# Patient Record
Sex: Female | Born: 2000 | Race: Black or African American | Hispanic: No
Health system: Southern US, Community
[De-identification: ages and names within clinical notes are randomized; demographics above are authoritative.]

## PROBLEM LIST (undated history)

## (undated) HISTORY — PX: WISDOM TOOTH EXTRACTION: SHX21

---
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
## 2018-04-10 ENCOUNTER — Other Ambulatory Visit: Payer: Self-pay

## 2018-04-10 ENCOUNTER — Emergency Department (HOSPITAL_COMMUNITY)
Admission: EM | Admit: 2018-04-10 | Discharge: 2018-04-10 | Disposition: A | Discharge status: Home / Self Care | Payer: Managed Care, Other (non HMO) | Attending: Emergency Medicine | Admitting: Emergency Medicine

## 2018-04-10 ENCOUNTER — Encounter (HOSPITAL_COMMUNITY): Payer: Self-pay | Admitting: Emergency Medicine

## 2018-04-10 DIAGNOSIS — Z79899 Other long term (current) drug therapy: Secondary | ICD-10-CM | POA: Diagnosis not present

## 2018-04-10 DIAGNOSIS — H66002 Acute suppurative otitis media without spontaneous rupture of ear drum, left ear: Secondary | ICD-10-CM | POA: Diagnosis not present

## 2018-04-10 DIAGNOSIS — H9202 Otalgia, left ear: Secondary | ICD-10-CM | POA: Diagnosis present

## 2018-04-10 MED ORDER — FLUCONAZOLE 150 MG PO TABS: 150.0000 mg | ORAL_TABLET | Freq: Once | ORAL | 0 refills | Status: AC

## 2018-04-10 MED ORDER — AMOXICILLIN 500 MG PO CAPS: 500.0000 mg | ORAL_CAPSULE | Freq: Three times a day (TID) | ORAL | 0 refills | Status: DC

## 2018-04-10 NOTE — ED Notes (Signed)
Patient verbalizes understanding of discharge instructions. Opportunity for questioning and answers were provided. Armband removed by staff, pt discharged from ED.  

## 2018-04-10 NOTE — Discharge Instructions (Addendum)
Please take all of your antibiotic medication as prescribed. Please follow-up with your primary care provider regarding your visit today. You may use the one-time dose of fluconazole if you require it for a yeast infection.  But please follow-up with your primary care provider regarding this as well. You may take over-the-counter anti-inflammatory medications as needed for pain.  Contact a doctor if: You have otitis media only in one ear, or bleeding from your nose, or both. You notice a lump on your neck. You are not getting better in 3-5 days. You feel worse instead of better. Get help right away if: You have pain that is not helped with medicine. You have puffiness, redness, or pain around your ear. You get a stiff neck. You cannot move part of your face (paralysis). You notice that the bone behind your ear hurts when you touch it

## 2018-04-10 NOTE — ED Triage Notes (Signed)
Pt reports left ear pain since 7/4, denies swimming or any known injury. States ear feels full, used ear candle to see if she could get relief but states no wax came out. Not taking any meds for pain

## 2018-04-10 NOTE — ED Provider Notes (Signed)
[facility] EMERGENCY DEPARTMENT Provider Note   CSN: [id number] Arrival date & time: 04/10/18  9562     History   Chief Complaint Chief Complaint  Patient presents with  . Otalgia    HPI Victoria Meza is a 17 y.o. female presenting with left ear pain that began on Friday morning 04/08/2018.  Patient states that after 4 July she went to sleep without pain and then woke up with left ear pain that has gradually worsened.  Patient describes her pain as a full feeling that is occasionally sharp.  Patient describes her pain as a moderate 5/10 in severity.  Patient endorses a feeling of muffled hearing to the left ear.  Patient has been using ear candles without relief for the past 2 days. Patient denies history of fever, swimming, nausea, vomiting, headache. HPI  History reviewed. No pertinent past medical history.  There are no active problems to display for this patient.   Past Surgical History:  Procedure Laterality Date  . WISDOM TOOTH EXTRACTION    . WISDOM TOOTH EXTRACTION       OB History   None      Home Medications    Prior to Admission medications   Medication Sig Start Date End Date Taking? Authorizing Provider  albuterol (PROVENTIL HFA;VENTOLIN HFA) 108 (90 Base) MCG/ACT inhaler Inhale 1-2 puffs into the lungs every 6 (six) hours as needed for wheezing or shortness of breath.    [provider]  amoxicillin (AMOXIL) 500 MG capsule Take 1 capsule (500 mg total) by mouth 3 (three) times daily. 04/10/18   Harlene Salts A, PA-C  ECHINACEA PO Take 1 tablet by mouth daily.    [provider]  fluconazole (DIFLUCAN) 150 MG tablet Take 1 tablet (150 mg total) by mouth once for 1 dose. 04/10/18 04/10/18  Harlene Salts A, PA-C  ondansetron (ZOFRAN ODT) 4 MG disintegrating tablet 4mg  ODT q4 hours prn nausea/vomit 10/10/15   Hess, Melina Schools M, PA-C  ondansetron (ZOFRAN ODT) 4 MG disintegrating tablet Take 1 tablet (4 mg total) by mouth every 8  (eight) hours as needed for nausea or vomiting. 04/17/17   Earley Favor, NP  predniSONE (DELTASONE) 20 MG tablet Take 2 tablets (40 mg total) by mouth daily with breakfast. For the next four days 08/26/17   Gerhard Munch, MD  vitamin C (ASCORBIC ACID) 500 MG tablet Take 500 mg by mouth daily.    [provider]    Family History No family history on file.  Social History Social History   Tobacco Use  . Smoking status: Never Smoker  . Smokeless tobacco: Never Used  Substance Use Topics  . Alcohol use: No    Frequency: Never  . Drug use: No     Allergies   Patient has no known allergies.   Review of Systems Review of Systems  Constitutional: Negative.  Negative for chills and fever.  HENT: Positive for ear pain. Negative for congestion, ear discharge, rhinorrhea, sinus pressure, sinus pain, sore throat, trouble swallowing and voice change.   Eyes: Negative.  Negative for pain, discharge and visual disturbance.  Respiratory: Negative.  Negative for cough and shortness of breath.   Cardiovascular: Negative.   Gastrointestinal: Negative.  Negative for abdominal pain, diarrhea, nausea and vomiting.  Genitourinary: Negative.  Negative for dysuria and hematuria.  Musculoskeletal: Negative.  Negative for myalgias and neck pain.  Skin: Negative.  Negative for rash.  Neurological: Negative.  Negative for headaches.  Physical Exam Updated Vital Signs BP 111/80 (BP Location: Right Arm)   Pulse 85   Temp 98.4 F (36.9 C) (Oral)   Resp 18   Ht 5\' 5"  (1.651 m)   Wt 56.2 kg (124 lb)   SpO2 98%   BMI 20.63 kg/m   Physical Exam  Constitutional: She is oriented to person, place, and time. She appears well-developed and well-nourished. No distress.  HENT:  Head: Normocephalic and atraumatic.  Right Ear: Hearing, tympanic membrane, external ear and ear canal normal. No drainage or swelling. No mastoid tenderness. Tympanic membrane is not perforated, not erythematous  and not bulging.  Left Ear: External ear normal. No drainage or swelling. No mastoid tenderness. Tympanic membrane is erythematous and bulging. Tympanic membrane is not perforated.  Nose: Rhinorrhea present.  Mouth/Throat: Uvula is midline. No trismus in the jaw. No posterior oropharyngeal edema or tonsillar abscesses.    Patient with erythematous, bulging left tympanic membrane without signs of rupture or drainage.  No mastoid tenderness or swelling. Posterior oropharynx erythematous with single vesicle noted on the left palatal pharyngeal arch.  No signs of swelling, peritonsillar abscess, Ludwig's angina, retropharyngeal abscess.  Uvula is midline, no tonsillar swelling. Nasal mucosa slightly erythematous with mild rhinorrhea present.   Eyes: Pupils are equal, round, and reactive to light. EOM are normal.  Neck: Normal range of motion. Neck supple. No tracheal deviation present.  Pulmonary/Chest: Effort normal.  Abdominal: Soft. There is no tenderness. There is no guarding.  Musculoskeletal: Normal range of motion.  Lymphadenopathy:    She has no cervical adenopathy.  Neurological: She is alert and oriented to person, place, and time.  Skin: Skin is warm and dry.  Psychiatric: She has a normal mood and affect. Her behavior is normal.     ED Treatments / Results  Labs (all labs ordered are listed, but only abnormal results are displayed) Labs Reviewed - No data to display  EKG None  Radiology No results found.  Procedures Procedures (including critical care time)  Medications Ordered in ED Medications - No data to display   Initial Impression / Assessment and Plan / ED Course  I have reviewed the triage vital signs and the nursing notes.  Pertinent labs & imaging results that were available during my care of the patient were reviewed by me and considered in my medical decision making (see chart for details).    Patient with clinical signs of upper respiratory  infection but denies symptoms.   Patient presenting with left ear pain for 2 days.  Clinical findings consistent with acute otitis media of the left ear.  Patient will be treated with amoxicillin 500 mg 3 times daily for the next 7 days.  Patient without clinical signs or symptoms suggestive of mastoiditis, perforation, otitis externa.  Patient denies history of swimming, otorrhea, fever.  Patient denies recent treatment with antibiotics.  Patient encouraged to follow-up with her primary care provider regarding her visit today.  Patient informed that she may use over-the-counter anti-inflammatory medications as needed for pain.  Patient states that in the past when she takes antibiotic medication she often develops a vaginal yeast infection and is requesting Diflucan in case she develops symptoms.  I have prescribed a one-time dose of Diflucan and informed that she may use it if she develops symptoms.  I have encouraged the patient to follow-up with her primary care provider, [facility], for her ear infection as well as if she develops a yeast infection.  At this time there does not appear to be any evidence of an acute emergency medical condition and the patient appears stable for discharge with antibiotic treatment and appropriate outpatient follow up. Diagnosis was discussed with patient who verbalizes understanding and is agreeable to discharge. I have discussed return precautions with patient and family who verbalize understanding of return precautions. Patient strongly encouraged to follow-up with their PCP.  Patient's case discussed with Harolyn Rutherford PA-C who agrees with plan to discharge with follow-up.     This note was dictated using DragonOne dictation software; please contact for any inconsistencies within the note.   Final Clinical Impressions(s) / ED Diagnoses   Final diagnoses:  Non-recurrent acute suppurative otitis media of left ear without spontaneous rupture of  tympanic membrane    ED Discharge Orders        Ordered    amoxicillin (AMOXIL) 500 MG capsule  3 times daily     04/10/18 0926    fluconazole (DIFLUCAN) 150 MG tablet   Once     04/10/18 2956       Bill Salinas, PA-C 04/10/18 0957    Margarita Grizzle, MD 04/10/18 [phone]

## 2018-11-04 ENCOUNTER — Encounter (HOSPITAL_COMMUNITY): Payer: Self-pay | Admitting: Emergency Medicine

## 2018-11-04 ENCOUNTER — Emergency Department (HOSPITAL_COMMUNITY)
Admission: EM | Admit: 2018-11-04 | Discharge: 2018-11-04 | Disposition: A | Discharge status: Home / Self Care | Payer: Managed Care, Other (non HMO) | Attending: Emergency Medicine | Admitting: Emergency Medicine

## 2018-11-04 DIAGNOSIS — R300 Dysuria: Secondary | ICD-10-CM | POA: Insufficient documentation

## 2018-11-04 DIAGNOSIS — N644 Mastodynia: Secondary | ICD-10-CM | POA: Insufficient documentation

## 2018-11-04 DIAGNOSIS — L03313 Cellulitis of chest wall: Secondary | ICD-10-CM

## 2018-11-04 DIAGNOSIS — Z79899 Other long term (current) drug therapy: Secondary | ICD-10-CM | POA: Insufficient documentation

## 2018-11-04 LAB — URINALYSIS, ROUTINE W REFLEX MICROSCOPIC
BILIRUBIN URINE: NEGATIVE
Bacteria, UA: NONE SEEN
GLUCOSE, UA: NEGATIVE mg/dL
HGB URINE DIPSTICK: NEGATIVE
KETONES UR: NEGATIVE mg/dL
Nitrite: NEGATIVE
PH: 6 (ref 5.0–8.0)
Protein, ur: NEGATIVE mg/dL
Specific Gravity, Urine: 1.015 (ref 1.005–1.030)

## 2018-11-04 LAB — POC URINE PREG, ED: Preg Test, Ur: NEGATIVE

## 2018-11-04 MED ORDER — CEPHALEXIN 500 MG PO CAPS: 500.0000 mg | ORAL_CAPSULE | Freq: Four times a day (QID) | ORAL | 0 refills | Status: AC

## 2018-11-04 NOTE — ED Triage Notes (Signed)
Pt with left breast with pain and nodule found 2 days ago. She also reports dysuria, with frequency and urgency. Denies abdominal pain or abnormal bleeding.

## 2018-11-04 NOTE — Discharge Instructions (Signed)
Your exam today was consistent with possible cellulitis of the left breast.  Low suspicion for a tumor or deep infection.  No abscess was palpated.  Please take the antibiotics for the next week to help treat the likely skin infection.  Please follow-up with the breast center for further evaluation management.  Please follow-up with your PCP for further overall management.  If any symptoms change or worsen, please return to the nearest emergency department.

## 2018-11-04 NOTE — ED Notes (Signed)
ED Provider at bedside. 

## 2018-11-04 NOTE — ED Provider Notes (Signed)
[facility] Provider Note   CSN: [id number] Arrival date & time: 11/04/18  1215     History   Chief Complaint Chief Complaint  Patient presents with  . Breast Pain  . Urinary Tract Infection    HPI Victoria Meza is a 18 y.o. female.  The history is provided by the patient, a parent and medical records. No language interpreter was used.  Urinary Tract Infection  Pain quality:  Burning Pain severity:  Moderate Onset quality:  Gradual Duration:  3 days Timing:  Constant Progression:  Waxing and waning Chronicity:  Recurrent Relieved by:  Nothing Worsened by:  Nothing Ineffective treatments:  None tried Urinary symptoms: foul-smelling urine and frequent urination   Associated symptoms: no abdominal pain, no fever, no flank pain, no nausea, no vaginal discharge and no vomiting     History reviewed. No pertinent past medical history.  There are no active problems to display for this patient.   Past Surgical History:  Procedure Laterality Date  . WISDOM TOOTH EXTRACTION    . WISDOM TOOTH EXTRACTION       OB History   No obstetric history on file.      Home Medications    Prior to Admission medications   Medication Sig Start Date End Date Taking? Authorizing Provider  albuterol (PROVENTIL HFA;VENTOLIN HFA) 108 (90 Base) MCG/ACT inhaler Inhale 1-2 puffs into the lungs every 6 (six) hours as needed for wheezing or shortness of breath.    [provider]  amoxicillin (AMOXIL) 500 MG capsule Take 1 capsule (500 mg total) by mouth 3 (three) times daily. 04/10/18   Harlene SaltsMorelli, Brandon A, PA-C  ECHINACEA PO Take 1 tablet by mouth daily.    [provider]  ondansetron (ZOFRAN ODT) 4 MG disintegrating tablet 4mg  ODT q4 hours prn nausea/vomit 10/10/15   Hess, Melina Schoolsobyn M, PA-C  ondansetron (ZOFRAN ODT) 4 MG disintegrating tablet Take 1 tablet (4 mg total) by mouth every 8 (eight) hours as needed for nausea or vomiting.  04/17/17   Earley FavorSchulz, Gail, NP  predniSONE (DELTASONE) 20 MG tablet Take 2 tablets (40 mg total) by mouth daily with breakfast. For the next four days 08/26/17   Gerhard MunchLockwood, Robert, MD  vitamin C (ASCORBIC ACID) 500 MG tablet Take 500 mg by mouth daily.    [provider]    Family History No family history on file.  Social History Social History   Tobacco Use  . Smoking status: Never Smoker  . Smokeless tobacco: Never Used  Substance Use Topics  . Alcohol use: No    Frequency: Never  . Drug use: No     Allergies   Patient has no known allergies.   Review of Systems Review of Systems  Constitutional: Negative for chills, diaphoresis, fatigue and fever.  HENT: Negative for congestion and rhinorrhea.   Respiratory: Negative for cough, chest tightness, shortness of breath and wheezing.   Cardiovascular: Negative for chest pain, palpitations and leg swelling.  Gastrointestinal: Negative for abdominal pain, constipation, diarrhea, nausea and vomiting.  Genitourinary: Positive for dysuria and frequency. Negative for decreased urine volume, flank pain, genital sores, pelvic pain, urgency, vaginal bleeding, vaginal discharge and vaginal pain.  Musculoskeletal: Negative for back pain, neck pain and neck stiffness.  Skin: Negative for rash and wound.  Neurological: Negative for light-headedness, numbness and headaches.  Psychiatric/Behavioral: Negative for agitation, behavioral problems and confusion.     Physical Exam Updated Vital Signs BP (!) 141/75 (BP Location: Right  Arm)   Pulse 88   Temp 97.7 F (36.5 C) (Oral)   Resp 17   SpO2 100%   Physical Exam Exam conducted with a chaperone present.  Constitutional:      General: She is not in acute distress.    Appearance: Normal appearance. She is not ill-appearing, toxic-appearing or diaphoretic.  HENT:     Head: Normocephalic.     Nose: Nose normal. No congestion or rhinorrhea.     Mouth/Throat:     Mouth: Mucous  membranes are moist.     Pharynx: No oropharyngeal exudate or posterior oropharyngeal erythema.  Eyes:     Conjunctiva/sclera: Conjunctivae normal.     Pupils: Pupils are equal, round, and reactive to light.  Neck:     Musculoskeletal: No muscular tenderness.  Cardiovascular:     Rate and Rhythm: Normal rate.     Pulses: Normal pulses.     Heart sounds: No murmur.  Pulmonary:     Effort: Pulmonary effort is normal. No respiratory distress.     Breath sounds: No stridor. No wheezing, rhonchi or rales.  Chest:     Chest wall: Tenderness present. No mass, lacerations, deformity, swelling, crepitus or edema.     Breasts: Breasts are symmetrical.        Right: No swelling, inverted nipple, mass, nipple discharge, skin change or tenderness.        Left: Tenderness present. No swelling, inverted nipple, mass, nipple discharge or skin change.    Abdominal:     General: Abdomen is flat. There is no distension.     Tenderness: There is no abdominal tenderness. There is no right CVA tenderness or left CVA tenderness.  Musculoskeletal:        General: Tenderness present. No deformity.     Right lower leg: No edema.     Left lower leg: No edema.  Skin:    Capillary Refill: Capillary refill takes less than 2 seconds.     Findings: No erythema.  Neurological:     General: No focal deficit present.     Mental Status: She is alert and oriented to person, place, and time.     Sensory: No sensory deficit.     Motor: No weakness.  Psychiatric:        Mood and Affect: Mood normal.      ED Treatments / Results  Labs (all labs ordered are listed, but only abnormal results are displayed) Labs Reviewed  URINALYSIS, ROUTINE W REFLEX MICROSCOPIC - Abnormal; Notable for the following components:      Result Value   APPearance HAZY (*)    Leukocytes, UA SMALL (*)    All other components within normal limits  URINE CULTURE  POC URINE PREG, ED    EKG None  Radiology No results  found.  Procedures Procedures (including critical care time)  Medications Ordered in ED Medications - No data to display   Initial Impression / Assessment and Plan / ED Course  I have reviewed the triage vital signs and the nursing notes.  Pertinent labs & imaging results that were available during my care of the patient were reviewed by me and considered in my medical decision making (see chart for details).     Victoria Meza is a 18 y.o. female with no significant past medi history who presents with 2 concerns, urinary frequency, urgency, and dysuria as well as left breast pain.  Patient is accompanied by her mother who reports that she intermittently has  dysuria.  Mother has treated her with Azo but she continues to have symptoms.  She reports that is gone for the last few days.  She denies nausea vomiting, constipation, diarrhea.  She denies fevers or chills.  She denies any shortness of breath, congestion, cough, URI symptoms.  Patient second complaint is left breast pain.  Mother reports that they have a strong family history of fibrocystic breast disease.  Mother was concerned when she felt a nodule on the patient's breast today and the patient is felt that this discomfort for the last 2 days.  Patient just finished her menstrual cycle last week.  Patient does not think she is pregnant.  Has never had a breast infection or any other concern for abnormalities of the breast tissue.  Chaperone was present for exam.  On exam, patient had some tenderness diffusely in the left breast.  No lumps or bumps were palpated.  Ariel appeared symmetric on my exam.  No tissue was expressed from the breast.  No stippling or other abnormality on skin exam.  Lungs were clear and chest was otherwise nontender.  No murmur.  Right breast unremarkable.  Abdomen nontender.  Patient otherwise appears well.  Patient will have urinalysis to look for UTI.  Patient denies any pelvic symptoms, will not pursue  pelvic exam at this time.  Do not feel patient need screening laboratory testing however will check the urine.  Clinical aspect patient has idiopathic breast pain.  Low suspicion for cellulitis given lack of any discoloration, erythema, warmth.  No evidence of abscess on exam.  No fluctuance or crepitance.  Suspect possible fibrocystic breast disease.  The mother was not able to feel the lump that she palpated yesterday.  This might support that diagnosis.  Suspect patient may have mild cellulitis of the breast.  Patient will follow-up with PCP and the breast center for further evaluation of the breast tissue.  Do not feel she needs emergent imaging at this time.  Patient be reassessed after urinalysis.  Urinalysis shows no nitrites or bacteria, doubt UTI.  Culture will be sent.  For the patient's possible breast cellulitis, given her amount of tenderness, will prescribe antibiotics for presumptive cellulitis.  Patient will follow-up with the breast center for further evaluation of possible nodules.  Patient family agree with plan of care and management structures.  Patient with questions or concerns and was discharged in good condition.   Final Clinical Impressions(s) / ED Diagnoses   Final diagnoses:  Breast pain  Cellulitis of chest wall  Dysuria    ED Discharge Orders         Ordered    cephALEXin (KEFLEX) 500 MG capsule  4 times daily     11/04/18 1346          Clinical Impression: 1. Breast pain   2. Cellulitis of chest wall   3. Dysuria     Disposition: Discharge  Condition: Good  I have discussed the results, Dx and Tx plan with the pt(& family if present). He/she/they expressed understanding and agree(s) with the plan. Discharge instructions discussed at great length. Strict return precautions discussed and pt &/or family have verbalized understanding of the instructions. No further questions at time of discharge.    New Prescriptions   CEPHALEXIN (KEFLEX) 500 MG  CAPSULE    Take 1 capsule (500 mg total) by mouth 4 (four) times daily for 7 days.    Follow Up: Laurann MontanaWhite, Cynthia, MD [phone] [place]  [place] [address] [phone]     [facility] [address] [place] [id number] mc [place] [address] [phone]    [facility] EMERGENCY DEPARTMENT [address] [phone] mc [place] [address] [phone]       Damontay Alred, Canary Brim, MD 11/04/18 [phone]

## 2018-11-04 NOTE — ED Notes (Signed)
sunquest not available.

## 2018-11-05 LAB — URINE CULTURE: Culture: NO GROWTH

## 2018-11-24 ENCOUNTER — Other Ambulatory Visit: Payer: Self-pay | Admitting: Family Medicine

## 2018-11-24 DIAGNOSIS — N649 Disorder of breast, unspecified: Secondary | ICD-10-CM

## 2018-11-28 ENCOUNTER — Ambulatory Visit
Admission: RE | Admit: 2018-11-28 | Discharge: 2018-11-28 | Disposition: A | Discharge status: Home / Self Care | Payer: Managed Care, Other (non HMO) | Source: Ambulatory Visit | Attending: Family Medicine | Admitting: Family Medicine

## 2018-11-28 DIAGNOSIS — N649 Disorder of breast, unspecified: Secondary | ICD-10-CM

## 2018-12-03 IMAGING — CT CT ANGIO CHEST
2 of 6 series · 18 of 36 positions shown · IV contrast (Omni 300)
Comparison: Chest x-ray 08/26/2017

CLINICAL DATA: Chest pain since last evening.

EXAM:
CT ANGIOGRAPHY CHEST WITH CONTRAST
TECHNIQUE: Multidetector CT imaging of the chest was performed using the
standard protocol during bolus administration of intravenous
contrast. Multiplanar CT image reconstructions and MIPs were
obtained to evaluate the vascular anatomy.
CONTRAST:  100mL TPNYOG-52P IOPAMIDOL (TPNYOG-52P) INJECTION 76%

[Series 11: pe thins · axial · 0.65mm/px · z∈[+1296,+1488]mm · 17 of 217 slices shown]
[im 13/217  lung]
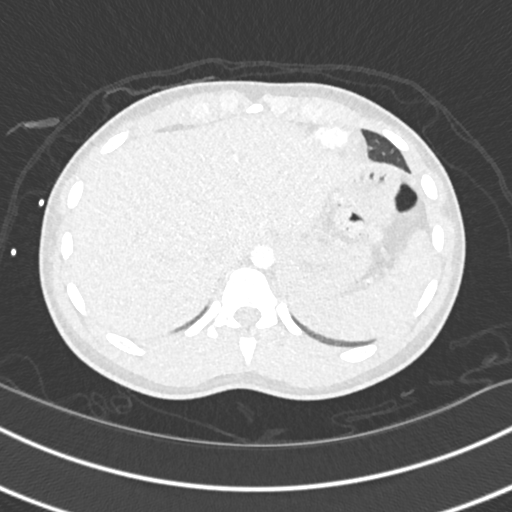
[im 25/217  mediastinal]
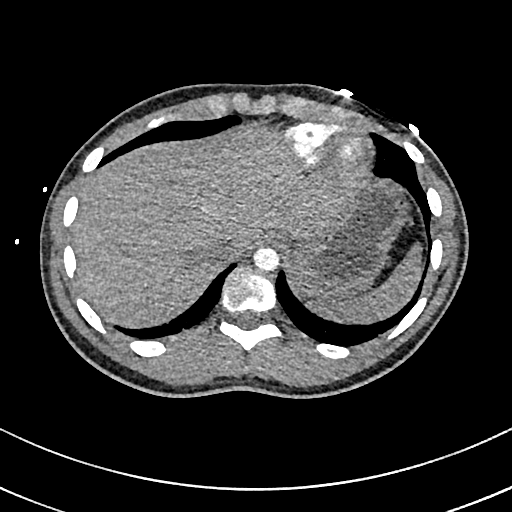
[im 37/217  lung]
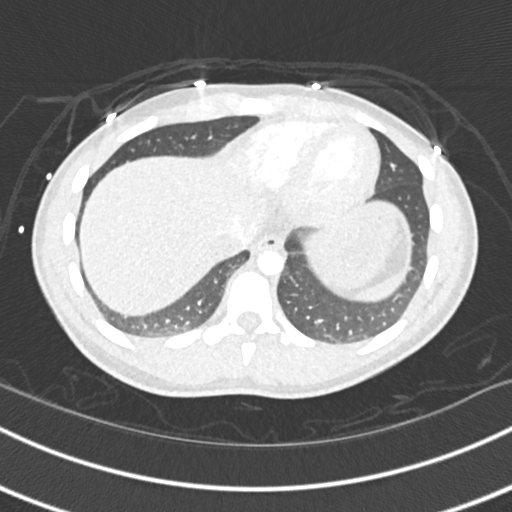
[im 49/217  mediastinal]
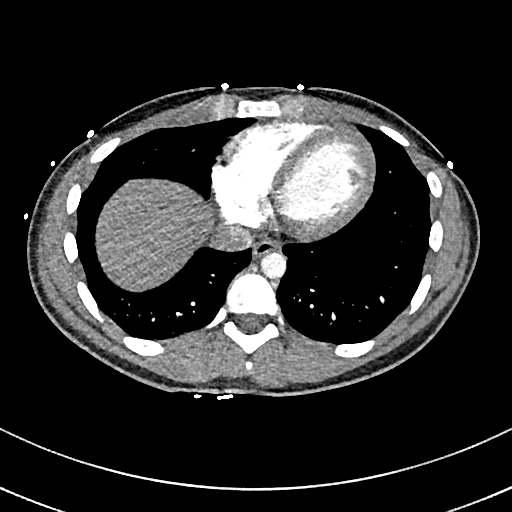
[im 61/217  lung]
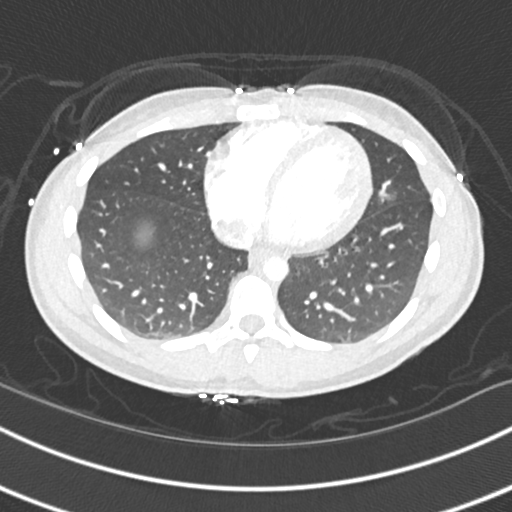
[im 73/217  mediastinal]
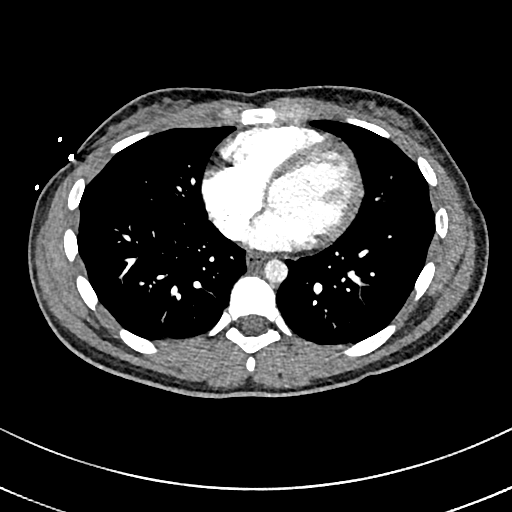
[im 85/217  lung]
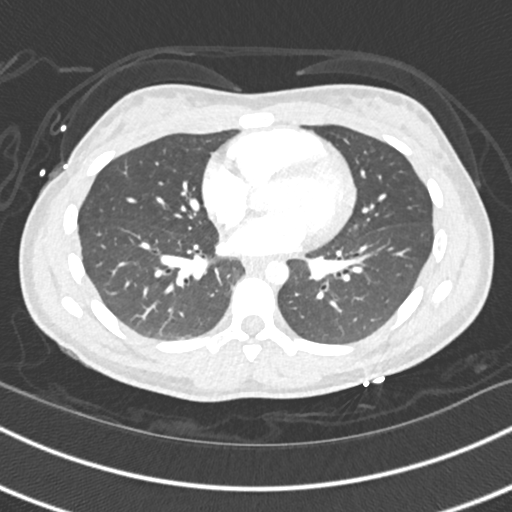
[im 97/217  mediastinal]
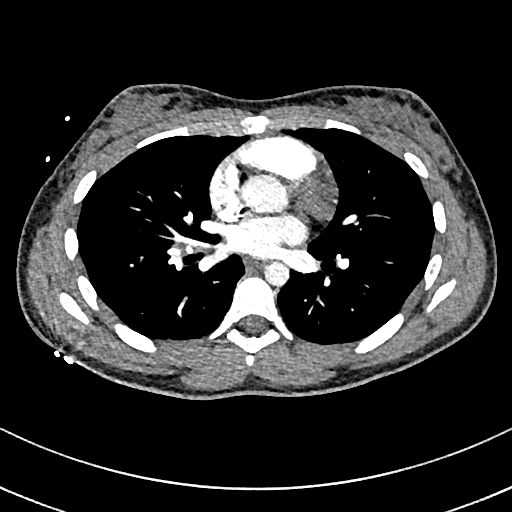
[im 109/217  lung]
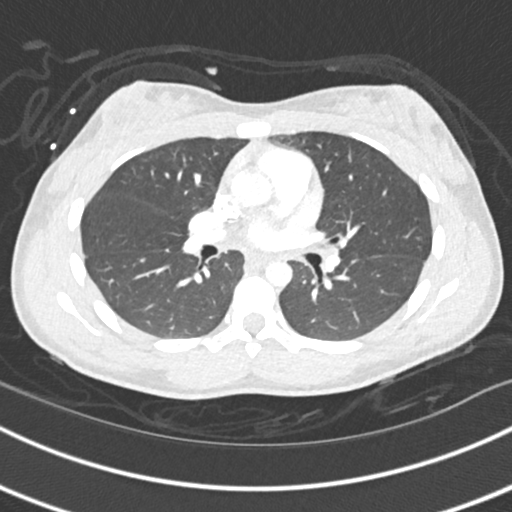
[im 121/217  mediastinal]
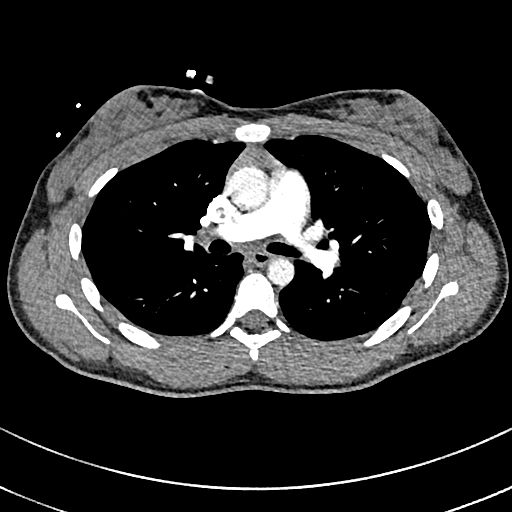
[im 133/217  lung]
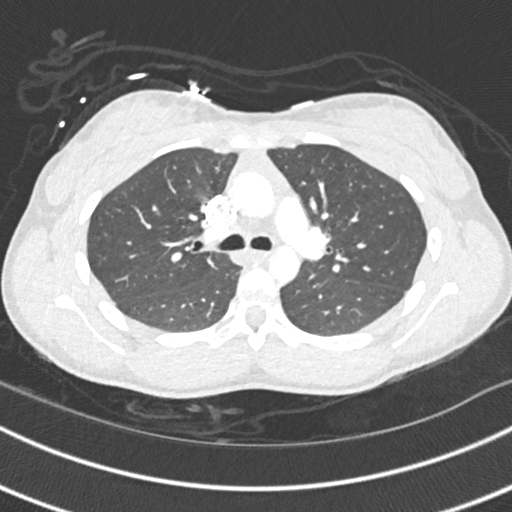
[im 145/217  mediastinal]
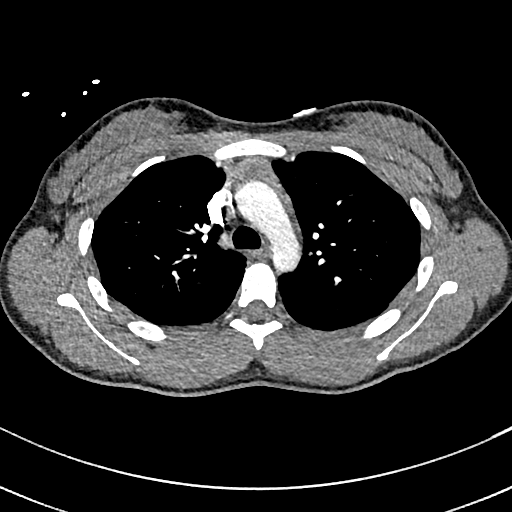
[im 157/217  lung]
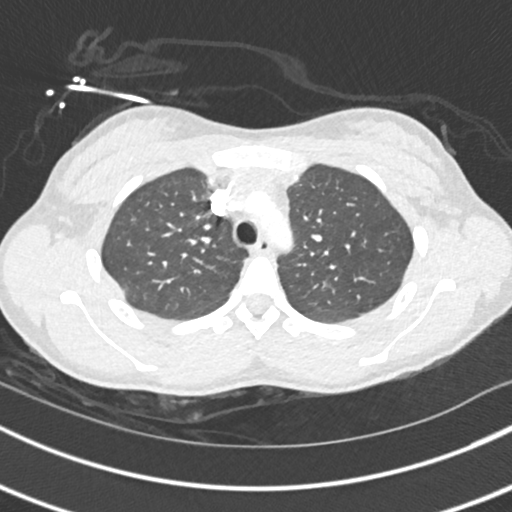
[im 169/217  mediastinal]
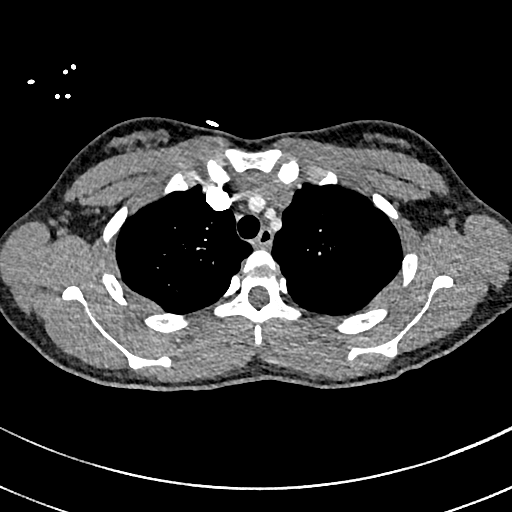
[im 181/217  lung]
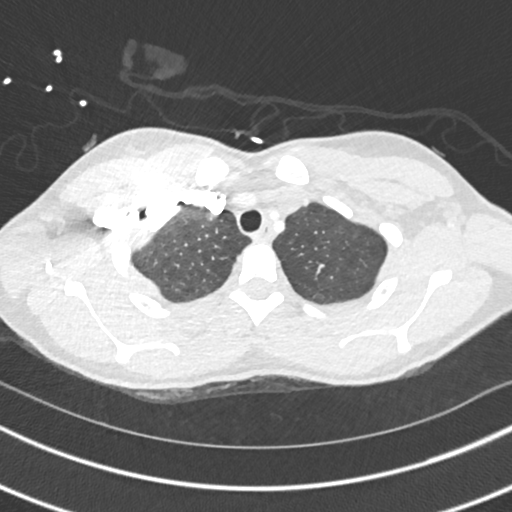
[im 193/217  mediastinal]
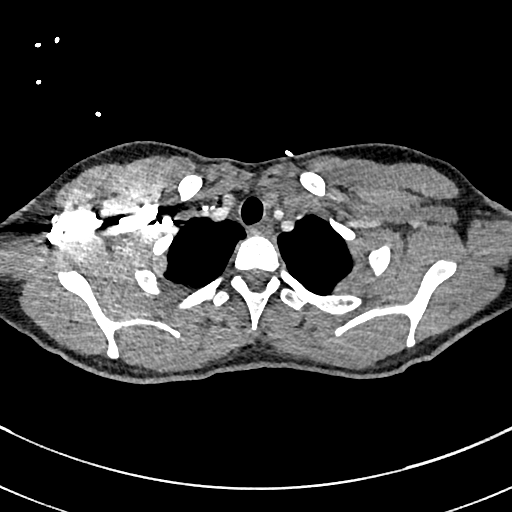
[im 205/217  lung]
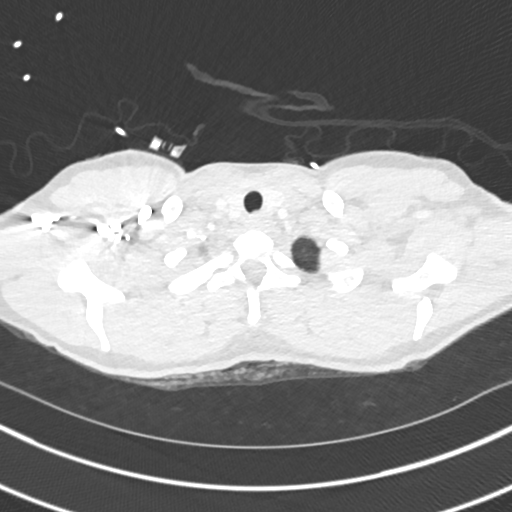

[Series 12: pe 2mm cor · coronal · 0.46mm/px · 1 of 149 slices shown]
[im 75/149  mediastinal]
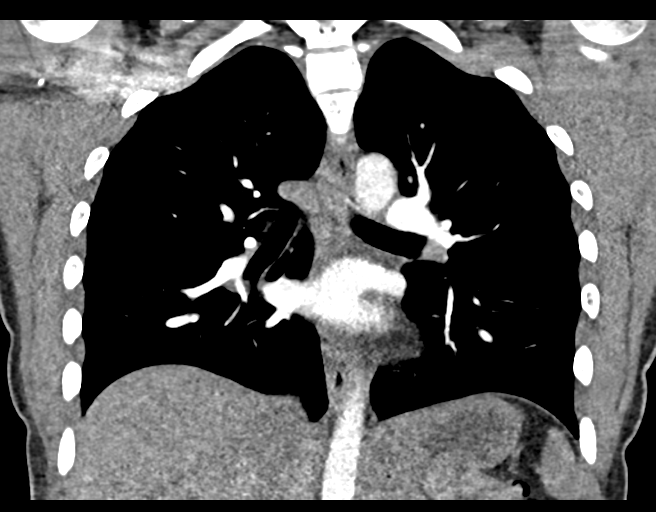

[18 of 36 positions shown; findings below may reference images not displayed]

FINDINGS: Cardiovascular: The heart is normal in size. No pericardial
effusion. The thoracic aorta is normal in caliber. No dissection.
The branch vessels are patent.

The pulmonary arterial tree is well opacified. No findings for
pulmonary embolism.

Mediastinum/Nodes: No mediastinal or hilar mass or lymphadenopathy.
Normal thymic tissue noted in the anterior mediastinum.

Lungs/Pleura: A few patchy areas of atelectasis or possible
inflammation most notably in the lingula. No pulmonary lesions or
pleural effusion.

Upper Abdomen: No significant upper abdominal findings.

Musculoskeletal: No breast masses, supraclavicular or axillary
lymphadenopathy. Small scattered lymph nodes are noted. The thyroid
gland appears normal.

No significant bony findings.

Review of the MIP images confirms the above findings.
IMPRESSION: 1. No CT findings for pulmonary embolism.
2. Normal thoracic aorta and branch vessels.
3. A few patchy areas of atelectasis or inflammation. No focal
pneumonia.

## 2019-04-19 ENCOUNTER — Other Ambulatory Visit: Payer: Self-pay | Admitting: *Deleted

## 2019-04-19 DIAGNOSIS — Z20822 Contact with and (suspected) exposure to covid-19: Secondary | ICD-10-CM

## 2019-04-22 LAB — NOVEL CORONAVIRUS, NAA: SARS-CoV-2, NAA: NOT DETECTED

## 2019-04-28 ENCOUNTER — Telehealth: Payer: Self-pay | Admitting: General Practice

## 2019-04-28 NOTE — Telephone Encounter (Signed)
Pt given Covid-19 results. Spoke with pt directly. °

## 2020-06-04 ENCOUNTER — Ambulatory Visit: Payer: Self-pay | Attending: Critical Care Medicine

## 2020-06-04 DIAGNOSIS — Z23 Encounter for immunization: Secondary | ICD-10-CM

## 2020-06-04 NOTE — Progress Notes (Signed)
   Covid-19 Vaccination Clinic  Name:  Victoria Meza    MRN: [id number] DOB: [birth year]  06/04/2020  Victoria Meza was observed post Covid-19 immunization for 15 minutes without incident. She was provided with Vaccine Information Sheet and instruction to access the V-Safe system.   Victoria Meza was instructed to call 911 with any severe reactions post vaccine: Marland Kitchen Difficulty breathing  . Swelling of face and throat  . A fast heartbeat  . A bad rash all over body  . Dizziness and weakness   Immunizations Administered    Name Date Dose VIS Date Route   Moderna COVID-19 Vaccine 06/04/2020  1:37 PM 0.5 mL 09/2019 Intramuscular   Manufacturer: Moderna   Lot: [id number]   NDC: 27253-664-40

## 2020-06-25 ENCOUNTER — Ambulatory Visit: Payer: Self-pay | Attending: Internal Medicine

## 2020-06-25 DIAGNOSIS — Z23 Encounter for immunization: Secondary | ICD-10-CM

## 2020-06-25 NOTE — Progress Notes (Signed)
   Covid-19 Vaccination Clinic  Name:  Victoria Meza    MRN: [id number] DOB: July 06, 2001  06/25/2020  [name] was observed post Covid-19 immunization for 15 minutes without incident. She was provided with Vaccine Information Sheet and instruction to access the V-Safe system.   [name] was instructed to call 911 with any severe reactions post vaccine: Marland Kitchen Difficulty breathing  . Swelling of face and throat  . A fast heartbeat  . A bad rash all over body  . Dizziness and weakness   Immunizations Administered    Name Date Dose VIS Date Route   Pfizer COVID-19 Vaccine 06/25/2020  9:50 AM 0.3 mL 11/29/2018 Intramuscular   Manufacturer: [employer], [employer]   Lot: [id number]   NDC: [address]-8

## 2021-04-01 DIAGNOSIS — L501 Idiopathic urticaria: Secondary | ICD-10-CM | POA: Diagnosis not present

## 2021-05-14 DIAGNOSIS — L501 Idiopathic urticaria: Secondary | ICD-10-CM | POA: Diagnosis not present

## 2021-06-18 DIAGNOSIS — L501 Idiopathic urticaria: Secondary | ICD-10-CM | POA: Diagnosis not present

## 2021-07-14 DIAGNOSIS — L501 Idiopathic urticaria: Secondary | ICD-10-CM | POA: Diagnosis not present

## 2021-07-14 DIAGNOSIS — J3089 Other allergic rhinitis: Secondary | ICD-10-CM | POA: Diagnosis not present

## 2021-07-14 DIAGNOSIS — T783XXD Angioneurotic edema, subsequent encounter: Secondary | ICD-10-CM | POA: Diagnosis not present

## 2021-07-18 DIAGNOSIS — L501 Idiopathic urticaria: Secondary | ICD-10-CM | POA: Diagnosis not present

## 2021-09-12 DIAGNOSIS — L501 Idiopathic urticaria: Secondary | ICD-10-CM | POA: Diagnosis not present

## 2021-09-20 ENCOUNTER — Ambulatory Visit (HOSPITAL_COMMUNITY)
Admission: EM | Admit: 2021-09-20 | Discharge: 2021-09-20 | Disposition: A | Discharge status: Home / Self Care | Payer: BC Managed Care – PPO | Attending: Student | Admitting: Student

## 2021-09-20 ENCOUNTER — Other Ambulatory Visit: Payer: Self-pay

## 2021-09-20 ENCOUNTER — Encounter (HOSPITAL_COMMUNITY): Payer: Self-pay

## 2021-09-20 DIAGNOSIS — J039 Acute tonsillitis, unspecified: Secondary | ICD-10-CM | POA: Diagnosis not present

## 2021-09-20 MED ORDER — FLUCONAZOLE 150 MG PO TABS: 150.0000 mg | ORAL_TABLET | Freq: Every day | ORAL | 0 refills | Status: DC

## 2021-09-20 MED ORDER — AMOXICILLIN 875 MG PO TABS: 875.0000 mg | ORAL_TABLET | Freq: Two times a day (BID) | ORAL | 0 refills | Status: AC

## 2021-09-20 NOTE — Discharge Instructions (Addendum)
-  Amoxicillin twice daily x7 days. You can take this with food -Diflucan to prevent yeast infection  -Continue the over the counter medications  -Follow-up with your primary doctor to discuss tonsillectomy

## 2021-09-20 NOTE — ED Triage Notes (Signed)
Pt reports sore throat x 2 weeks. States she can not move the neck due to pain. Pt has not taken any OTC meds.

## 2021-09-20 NOTE — ED Provider Notes (Signed)
[place]    CSN: [id number] Arrival date & time: 09/20/21  1030      History   Chief Complaint Chief Complaint  Patient presents with   Sore Throat    HPI Victoria Meza is a 20 y.o. female presenting with sore throat. Medical history recurrent tonsillitis. Here today with mom. Describes waxing and waning sore throat x2 weeks with tender cervical lymphadenopathy. They have tried various OTC medications and mouthwashes with some improvement. States the continued swelling and pain in the lymph nodes concerned them, but she is tolerating fluids and foods without issue. Denies SOB, sensation of throat closing.   HPI  History reviewed. No pertinent past medical history.  There are no problems to display for this patient.   Past Surgical History:  Procedure Laterality Date   WISDOM TOOTH EXTRACTION     WISDOM TOOTH EXTRACTION      OB History   No obstetric history on file.      Home Medications    Prior to Admission medications   Medication Sig Start Date End Date Taking? Authorizing Provider  amoxicillin (AMOXIL) 875 MG tablet Take 1 tablet (875 mg total) by mouth 2 (two) times daily for 7 days. 09/20/21 09/27/21 Yes Hazel Sams, PA-C  fluconazole (DIFLUCAN) 150 MG tablet Take 1 tablet (150 mg total) by mouth daily. -For your yeast infection, start the Diflucan (fluconazole)- Take one pill today (day 1). If you're still having symptoms in 3 days, take the second pill. 09/20/21  Yes Hazel Sams, PA-C  albuterol (PROVENTIL HFA;VENTOLIN HFA) 108 (90 Base) MCG/ACT inhaler Inhale 1-2 puffs into the lungs every 6 (six) hours as needed for wheezing or shortness of breath.    [provider]  ECHINACEA PO Take 1 tablet by mouth daily.    [provider]  ondansetron (ZOFRAN ODT) 4 MG disintegrating tablet 4mg  ODT q4 hours prn nausea/vomit 10/10/15   Hess, Bailey Mech M, PA-C  ondansetron (ZOFRAN ODT) 4 MG disintegrating tablet Take 1 tablet (4  mg total) by mouth every 8 (eight) hours as needed for nausea or vomiting. 04/17/17   Junius Creamer, NP  predniSONE (DELTASONE) 20 MG tablet Take 2 tablets (40 mg total) by mouth daily with breakfast. For the next four days 08/26/17   Carmin Muskrat, MD  vitamin C (ASCORBIC ACID) 500 MG tablet Take 500 mg by mouth daily.    [provider]    Family History Family History  Problem Relation Age of Onset   Healthy Mother     Social History Social History   Tobacco Use   Smoking status: Never   Smokeless tobacco: Never  Vaping Use   Vaping Use: Never used  Substance Use Topics   Alcohol use: No   Drug use: Never     Allergies   Patient has no known allergies.   Review of Systems Review of Systems  Constitutional:  Negative for appetite change, chills and fever.  HENT:  Positive for sore throat. Negative for congestion, ear pain, rhinorrhea, sinus pressure and sinus pain.   Eyes:  Negative for redness and visual disturbance.  Respiratory:  Negative for cough, chest tightness, shortness of breath and wheezing.   Cardiovascular:  Negative for chest pain and palpitations.  Gastrointestinal:  Negative for abdominal pain, constipation, diarrhea, nausea and vomiting.  Genitourinary:  Negative for dysuria, frequency and urgency.  Musculoskeletal:  Negative for myalgias.  Neurological:  Negative for dizziness, weakness and headaches.  Psychiatric/Behavioral:  Negative  for confusion.   All other systems reviewed and are negative.   Physical Exam Triage Vital Signs ED Triage Vitals  Enc Vitals Group     BP      Pulse      Resp      Temp      Temp src      SpO2      Weight      Height      Head Circumference      Peak Flow      Pain Score      Pain Loc      Pain Edu?      Excl. in GC?    No data found.  Updated Vital Signs BP 126/67 (BP Location: Left Arm)    Pulse 93    Temp 98.2 F (36.8 C) (Oral)    Resp 16    LMP  (Within Weeks) Comment: 1 week   SpO2  100%   Visual Acuity Right Eye Distance:   Left Eye Distance:   Bilateral Distance:    Right Eye Near:   Left Eye Near:    Bilateral Near:     Physical Exam Vitals reviewed.  Constitutional:      General: She is not in acute distress.    Appearance: Normal appearance. She is not ill-appearing.  HENT:     Head: Normocephalic and atraumatic.     Right Ear: Tympanic membrane, ear canal and external ear normal. No tenderness. No middle ear effusion. There is no impacted cerumen. Tympanic membrane is not perforated, erythematous, retracted or bulging.     Left Ear: Tympanic membrane, ear canal and external ear normal. No tenderness.  No middle ear effusion. There is no impacted cerumen. Tympanic membrane is not perforated, erythematous, retracted or bulging.     Nose: Nose normal. No congestion.     Mouth/Throat:     Mouth: Mucous membranes are moist.     Pharynx: Uvula midline. Posterior oropharyngeal erythema present. No oropharyngeal exudate.     Tonsils: 1+ on the right. 1+ on the left.     Comments: Smooth erythema posterior pharynx On exam, uvula is midline, she is tolerating her secretions without difficulty, there is no trismus, no drooling, she has normal phonation Comfortable at rest and throughout exam Eyes:     Extraocular Movements: Extraocular movements intact.     Pupils: Pupils are equal, round, and reactive to light.  Cardiovascular:     Rate and Rhythm: Normal rate and regular rhythm.     Heart sounds: Normal heart sounds.  Pulmonary:     Effort: Pulmonary effort is normal.     Breath sounds: Normal breath sounds. No decreased breath sounds, wheezing, rhonchi or rales.  Abdominal:     Palpations: Abdomen is soft.     Tenderness: There is no abdominal tenderness. There is no guarding or rebound.  Lymphadenopathy:     Cervical: Cervical adenopathy present.     Right cervical: Superficial cervical adenopathy present.     Left cervical: Superficial cervical  adenopathy present.  Neurological:     General: No focal deficit present.     Mental Status: She is alert and oriented to person, place, and time.  Psychiatric:        Mood and Affect: Mood normal.        Behavior: Behavior normal.        Thought Content: Thought content normal.        Judgment: Judgment normal.  UC Treatments / Results  Labs (all labs ordered are listed, but only abnormal results are displayed) Labs Reviewed - No data to display  EKG   Radiology No results found.  Procedures Procedures (including critical care time)  Medications Ordered in UC Medications - No data to display  Initial Impression / Assessment and Plan / UC Course  I have reviewed the triage vital signs and the nursing notes.  Pertinent labs & imaging results that were available during my care of the patient were reviewed by me and considered in my medical decision making (see chart for details).     This patient is a very pleasant 20 y.o. year old female presenting with recurrent tonsillitis. Afebrile, nontachy. Given duration of symptoms, I am amenable to antibiotic management at this time; amoxicillin sent as below. Rapid strep deferred as amoxicillin will cover for this. They are in agreement. .ED return precautions discussed. Patient and mom verbalizes understanding and agreement. Rec f/u with PCP / ENT given recurrent tonsillitis. .   Final Clinical Impressions(s) / UC Diagnoses   Final diagnoses:  Acute tonsillitis, unspecified etiology     Discharge Instructions      -Amoxicillin twice daily x7 days. You can take this with food -Diflucan to prevent yeast infection  -Continue the over the counter medications  -Follow-up with your primary doctor to discuss tonsillectomy      ED Prescriptions     Medication Sig Dispense Auth. Provider   amoxicillin (AMOXIL) 875 MG tablet Take 1 tablet (875 mg total) by mouth 2 (two) times daily for 7 days. 14 tablet Rhys Martini,  PA-C   fluconazole (DIFLUCAN) 150 MG tablet Take 1 tablet (150 mg total) by mouth daily. -For your yeast infection, start the Diflucan (fluconazole)- Take one pill today (day 1). If you're still having symptoms in 3 days, take the second pill. 2 tablet Rhys Martini, PA-C      PDMP not reviewed this encounter.   Rhys Martini, PA-C 09/20/21 1231

## 2021-10-08 DIAGNOSIS — L501 Idiopathic urticaria: Secondary | ICD-10-CM | POA: Diagnosis not present

## 2022-01-13 DIAGNOSIS — J3089 Other allergic rhinitis: Secondary | ICD-10-CM | POA: Diagnosis not present

## 2022-01-13 DIAGNOSIS — T783XXD Angioneurotic edema, subsequent encounter: Secondary | ICD-10-CM | POA: Diagnosis not present

## 2022-01-13 DIAGNOSIS — L501 Idiopathic urticaria: Secondary | ICD-10-CM | POA: Diagnosis not present

## 2022-11-30 ENCOUNTER — Encounter: Payer: Self-pay | Admitting: Obstetrics and Gynecology

## 2022-11-30 ENCOUNTER — Ambulatory Visit (INDEPENDENT_AMBULATORY_CARE_PROVIDER_SITE_OTHER): Payer: BC Managed Care – PPO | Admitting: Obstetrics and Gynecology

## 2022-11-30 ENCOUNTER — Other Ambulatory Visit (HOSPITAL_COMMUNITY)
Admission: RE | Admit: 2022-11-30 | Discharge: 2022-11-30 | Disposition: A | Discharge status: Home / Self Care | Payer: BC Managed Care – PPO | Source: Ambulatory Visit | Attending: Obstetrics and Gynecology | Admitting: Obstetrics and Gynecology

## 2022-11-30 VITALS — BP 128/87 | HR 84 | Ht 64.0 in | Wt 131.6 lb

## 2022-11-30 DIAGNOSIS — Z3202 Encounter for pregnancy test, result negative: Secondary | ICD-10-CM

## 2022-11-30 DIAGNOSIS — Z113 Encounter for screening for infections with a predominantly sexual mode of transmission: Secondary | ICD-10-CM | POA: Diagnosis not present

## 2022-11-30 DIAGNOSIS — N898 Other specified noninflammatory disorders of vagina: Secondary | ICD-10-CM

## 2022-11-30 DIAGNOSIS — Z01419 Encounter for gynecological examination (general) (routine) without abnormal findings: Secondary | ICD-10-CM | POA: Diagnosis not present

## 2022-11-30 DIAGNOSIS — Z124 Encounter for screening for malignant neoplasm of cervix: Secondary | ICD-10-CM | POA: Insufficient documentation

## 2022-11-30 DIAGNOSIS — Z3009 Encounter for other general counseling and advice on contraception: Secondary | ICD-10-CM

## 2022-11-30 DIAGNOSIS — Z23 Encounter for immunization: Secondary | ICD-10-CM | POA: Diagnosis not present

## 2022-11-30 DIAGNOSIS — F411 Generalized anxiety disorder: Secondary | ICD-10-CM | POA: Diagnosis not present

## 2022-11-30 DIAGNOSIS — Z30013 Encounter for initial prescription of injectable contraceptive: Secondary | ICD-10-CM | POA: Diagnosis not present

## 2022-11-30 LAB — POCT URINE PREGNANCY: Preg Test, Ur: NEGATIVE

## 2022-11-30 MED ORDER — MEDROXYPROGESTERONE ACETATE 150 MG/ML IM SUSP
150.0000 mg | Freq: Once | INTRAMUSCULAR | Status: AC
  Administered 2022-11-30: 150 mg via INTRAMUSCULAR

## 2022-11-30 NOTE — Progress Notes (Signed)
ANNUAL EXAM Patient name: Victoria Meza MRN [id number]  Date of birth: 12/12/2000 Chief Complaint:   Gynecologic Exam  History of Present Illness:   Victoria Meza is a 22 y.o. G0P0000 with Patient's last menstrual period was 11/16/2022 (approximate). being seen today for a routine annual exam.  Current complaints:  Vaginal discharge - thick, white w/ itching & irritation Anxiety - issues sleeping, panic attacks Breast cysts & abscess - already completed MMG that was normal. Has burst and had purulent drainage in past. Has scarring but no current symptoms   Upstream - 11/30/22 0917       Pregnancy Intention Screening   Does the patient want to become pregnant in the next year? No    Does the patient's partner want to become pregnant in the next year? N/A    Would the patient like to discuss contraceptive options today? Yes      Contraception Wrap Up   Current Method Female Condom    End Method Hormonal Injection    Contraception Counseling Provided Yes    How was the end contraceptive method provided? Provided on site            The pregnancy intention screening data noted above was reviewed. Potential methods of contraception were discussed. The patient elected to proceed with Hormonal Injection.   Last pap never.  Last mammogram: never, no immediate FHX Last colonoscopy: never no immediate FHx HPV vaccine: completed     11/30/2022    9:15 AM  Depression screen PHQ 2/9  Decreased Interest 0  Down, Depressed, Hopeless 0  PHQ - 2 Score 0  Altered sleeping 0  Tired, decreased energy 2  Change in appetite 0  Feeling bad or failure about yourself  0  Trouble concentrating 0  Moving slowly or fidgety/restless 0  Suicidal thoughts 0  PHQ-9 Score 2      11/30/2022    9:16 AM  GAD 7 : Generalized Anxiety Score  Nervous, Anxious, on Edge 3  Control/stop worrying 3  Worry too much - different things 3  Trouble relaxing 3  Restless 0  Easily annoyed or  irritable 3  Afraid - awful might happen 0  Total GAD 7 Score 15  Anxiety Difficulty Not difficult at all   Review of Systems:   Pertinent items are noted in HPI Denies any headaches, blurred vision, fatigue, shortness of breath, chest pain, abdominal pain, abnormal vaginal discharge/itching/odor/irritation, problems with periods, bowel movements, urination, or intercourse unless otherwise stated above. Pertinent History Reviewed:  Reviewed past medical,surgical, social and family history.  Reviewed problem list, medications and allergies. Physical Assessment:   Vitals:   11/30/22 0910  BP: 128/87  Pulse: 84  Weight: 131 lb 9.6 oz (59.7 kg)  Height: '5\' 4"'$  (1.626 m)  Body mass index is 22.59 kg/m.        Physical Examination:   General appearance - well appearing, and in no distress  Mental status - alert, oriented to person, place, and time  Chest - respiratory effort normal  Heart - normal peripheral perfusion  Breasts - breasts appear normal, no suspicious masses, no nipple changes or axillary nodes. Skin changes noted at 10 o'clock of right areola - hyperpigmentation  Abdomen - soft, nontender, nondistended, no masses or organomegaly  Pelvic - VULVA: normal appearing vulva with no masses, tenderness or lesions  VAGINA: normal appearing vagina with normal color and discharge, no lesions  CERVIX: normal appearing cervix without discharge or lesions, no  CMT  Thin prep pap is done with reflex HR HPV cotesting  UTERUS: uterus is felt to be normal size, shape, consistency and nontender   ADNEXA: No adnexal masses or tenderness noted.  Chaperone present for exam  Results for orders placed or performed in visit on 11/30/22 (from the past 24 hour(s))  POCT urine pregnancy   Collection Time: 11/30/22 10:11 AM  Result Value Ref Range   Preg Test, Ur Negative Negative    Assessment & Plan:  1) Well-Woman Exam Cervical cancer screening Routine screening for STI (sexually  transmitted infection) Mammogram: @ 24yo, or sooner if problems Colonoscopy: @ 24yo, or sooner if problems Gardasil: completed GC/CT: collected HIV/HCV: ordered Pap: collected -     Cytology - PAP( [place]) -     HIV antibody (with reflex) -     Hepatitis C Antibody -     RPR -     Cervicovaginal ancillary only( [place])  2) Encounter for initial prescription of injectable contraceptive -     POCT urine pregnancy -     medroxyPROGESTERone (DEPO-PROVERA) injection 150 mg  3) Generalized anxiety disorder -     Ambulatory referral to Psychiatry -     Ambulatory referral to [place]  4) Vaginal discharge -     Cervicovaginal ancillary only( [place])  5) Need for influenza vaccination -     Flu Vaccine QUAD 36+ mos IM (Fluarix, Quad PF)  6) Hx breast cyst/abscess - discussed warm compresses & scheduling follow up if symptoms return  Labs/procedures today:   Orders Placed This Encounter  Procedures   Flu Vaccine QUAD 36+ mos IM (Fluarix, Quad PF)   HIV antibody (with reflex)   Hepatitis C Antibody   RPR   Ambulatory referral to Psychiatry   Ambulatory referral to [place]   POCT urine pregnancy   Meds:  Meds ordered this encounter  Medications   medroxyPROGESTERone (DEPO-PROVERA) injection 150 mg   Follow-up: Return in about 1 year (around 12/01/2023) for annual exam.  Inez Catalina, MD 11/30/2022 12:09 PM

## 2022-11-30 NOTE — Progress Notes (Signed)
New pt presents for annual exam. LMP approx 11/16/22. Pt never had a PAP. Pt would like DEPO for birth control. Pt reports having thick, white vaginal discharge and irritation. Denies odor. Denies any abnormal breast changes. PHQ negative. GAD positive. Would like referral for anxiety. No other concerns at this time.

## 2022-12-01 LAB — CERVICOVAGINAL ANCILLARY ONLY
Candida Glabrata: NEGATIVE
Candida Vaginitis: NEGATIVE
Comment: NEGATIVE
Comment: NEGATIVE
Comment: NEGATIVE
Trichomonas: NEGATIVE

## 2022-12-02 LAB — HIV ANTIBODY (ROUTINE TESTING W REFLEX): HIV Screen 4th Generation wRfx: NONREACTIVE

## 2022-12-02 LAB — RPR: RPR Ser Ql: NONREACTIVE

## 2022-12-02 LAB — HEPATITIS C ANTIBODY: Hep C Virus Ab: NONREACTIVE

## 2022-12-03 LAB — CYTOLOGY - PAP
Chlamydia: NEGATIVE
Comment: NEGATIVE
Comment: NORMAL
Diagnosis: NEGATIVE
Diagnosis: REACTIVE
Neisseria Gonorrhea: NEGATIVE

## 2023-03-08 ENCOUNTER — Other Ambulatory Visit: Payer: Self-pay | Admitting: *Deleted

## 2023-03-08 DIAGNOSIS — Z30013 Encounter for initial prescription of injectable contraceptive: Secondary | ICD-10-CM

## 2023-03-08 MED ORDER — MEDROXYPROGESTERONE ACETATE 150 MG/ML IM SUSP: 150.0000 mg | INTRAMUSCULAR | 3 refills | Status: DC

## 2023-03-08 NOTE — Progress Notes (Signed)
Annual 11/30/22. Provider note indicates RX Depo. RX not sent. RX sent today with refills for the year.

## 2023-03-15 ENCOUNTER — Ambulatory Visit (INDEPENDENT_AMBULATORY_CARE_PROVIDER_SITE_OTHER): Payer: BC Managed Care – PPO | Admitting: Emergency Medicine

## 2023-03-15 DIAGNOSIS — Z3042 Encounter for surveillance of injectable contraceptive: Secondary | ICD-10-CM

## 2023-03-15 LAB — POCT URINE PREGNANCY: Preg Test, Ur: NEGATIVE

## 2023-03-15 NOTE — Progress Notes (Signed)
Presents for first UPT for Depo restart. UPT negative. RN did not see pt, pt checked out before meeting RN.

## 2023-03-20 ENCOUNTER — Emergency Department (HOSPITAL_BASED_OUTPATIENT_CLINIC_OR_DEPARTMENT_OTHER)
Admission: EM | Admit: 2023-03-20 | Discharge: 2023-03-20 | Disposition: A | Discharge status: Home / Self Care | Payer: BC Managed Care – PPO | Attending: Emergency Medicine | Admitting: Emergency Medicine

## 2023-03-20 ENCOUNTER — Other Ambulatory Visit: Payer: Self-pay

## 2023-03-20 ENCOUNTER — Emergency Department (HOSPITAL_BASED_OUTPATIENT_CLINIC_OR_DEPARTMENT_OTHER): Payer: BC Managed Care – PPO

## 2023-03-20 ENCOUNTER — Encounter (HOSPITAL_BASED_OUTPATIENT_CLINIC_OR_DEPARTMENT_OTHER): Payer: Self-pay | Admitting: Emergency Medicine

## 2023-03-20 DIAGNOSIS — M26622 Arthralgia of left temporomandibular joint: Secondary | ICD-10-CM | POA: Insufficient documentation

## 2023-03-20 DIAGNOSIS — M26602 Left temporomandibular joint disorder, unspecified: Secondary | ICD-10-CM | POA: Diagnosis not present

## 2023-03-20 DIAGNOSIS — R6884 Jaw pain: Secondary | ICD-10-CM | POA: Diagnosis not present

## 2023-03-20 DIAGNOSIS — M26609 Unspecified temporomandibular joint disorder, unspecified side: Secondary | ICD-10-CM | POA: Diagnosis not present

## 2023-03-20 MED ORDER — CYCLOBENZAPRINE HCL 10 MG PO TABS: 10.0000 mg | ORAL_TABLET | Freq: Two times a day (BID) | ORAL | 0 refills | Status: AC | PRN

## 2023-03-20 MED ORDER — CYCLOBENZAPRINE HCL 10 MG PO TABS
10.0000 mg | ORAL_TABLET | Freq: Once | ORAL | Status: AC
  Administered 2023-03-20: 10 mg via ORAL
  Filled 2023-03-20: qty 1

## 2023-03-20 NOTE — ED Triage Notes (Signed)
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 Unsure when it became locked for today. Went online and saw mandibular reduction and is asking if we could do this. Pt reports she is not able to open her mouth all the way. No known injury.

## 2023-03-20 NOTE — ED Provider Notes (Signed)
 [place] [facility] AT [facility] Provider Note   CSN: [id number] Arrival date & time: 03/20/23  1342     History Chief Complaint  Patient presents with   Jaw Pain    Victoria Meza is a 22 y.o. female.  Patient presents to the emergency department complaints of jaw pain.  Reports this is a somewhat chronic problem in nature.  States that occasionally she has exacerbations and jaw pain with feeling that her jaw is getting stuck.  Reports that she still would not open her mouth and tolerate oral intake but some pain with chewing.  Has been seen by chiropractors and other specialist without significant improvement symptoms.  No prior imaging performed.  Currently taking ibuprofen and Flexeril at home without significant improvement.  Denies significant swelling, obvious dislocation.  HPI     Home Medications Prior to Admission medications   Medication Sig Start Date End Date Taking? Authorizing Provider  cyclobenzaprine (FLEXERIL) 10 MG tablet Take 1 tablet (10 mg total) by mouth 2 (two) times daily as needed for up to 15 days for muscle spasms. 03/20/23 04/04/23 Yes Smitty Knudsen, PA-C  albuterol (PROVENTIL HFA;VENTOLIN HFA) 108 (90 Base) MCG/ACT inhaler Inhale 1-2 puffs into the lungs every 6 (six) hours as needed for wheezing or shortness of breath. Patient not taking: Reported on 11/30/2022    [provider]  medroxyPROGESTERone (DEPO-PROVERA) 150 MG/ML injection Inject 1 mL (150 mg total) into the muscle every 3 (three) months. 03/08/23   Lennart Pall, MD      Allergies    Patient has no known allergies.    Review of Systems   Review of Systems  HENT:         Trismus  All other systems reviewed and are negative.   Physical Exam Updated Vital Signs BP 131/77   Pulse 71   Temp 98.3 F (36.8 C) (Oral)   Resp 15   Ht 5\' 4"  (1.626 m)   Wt 59 kg   SpO2 96%   BMI 22.31 kg/m  Physical Exam Vitals and nursing note reviewed.   Constitutional:      General: She is not in acute distress.    Appearance: She is well-developed and normal weight. She is not ill-appearing or toxic-appearing.  HENT:     Head: Normocephalic and atraumatic.  Eyes:     Conjunctiva/sclera: Conjunctivae normal.  Cardiovascular:     Rate and Rhythm: Normal rate and regular rhythm.     Heart sounds: No murmur heard. Pulmonary:     Effort: Pulmonary effort is normal. No respiratory distress.     Breath sounds: Normal breath sounds.  Abdominal:     Palpations: Abdomen is soft.     Tenderness: There is no abdominal tenderness.  Musculoskeletal:        General: Tenderness present. No swelling.     Cervical back: Neck supple. No rigidity or tenderness.     Comments: Tenderness to palpation over the left TMJ.   Negative bite test.  Lymphadenopathy:     Cervical: No cervical adenopathy.  Skin:    General: Skin is warm and dry.     Capillary Refill: Capillary refill takes less than 2 seconds.  Neurological:     Mental Status: She is alert.  Psychiatric:        Mood and Affect: Mood normal.     ED Results / Procedures / Treatments   Labs (all labs ordered are listed, but only abnormal  results are displayed) Labs Reviewed - No data to display  EKG None  Radiology CT Maxillofacial Wo Contrast  Result Date: 03/20/2023 CLINICAL DATA:  Temporomandibular joint (TMJ) pain. The patient reports their TMJ has become locked and then unlocked. EXAM: CT MAXILLOFACIAL WITHOUT CONTRAST TECHNIQUE: Multidetector CT imaging of the maxillofacial structures was performed. Multiplanar CT image reconstructions were also generated. RADIATION DOSE REDUCTION: This exam was performed according to the departmental dose-optimization program which includes automated exposure control, adjustment of the mA and/or kV according to patient size and/or use of iterative reconstruction technique. COMPARISON:  None Available. FINDINGS: Osseous: No fracture or mandibular  dislocation. The temporomandibular joints appear normally aligned. No destructive process. Orbits: Negative. No traumatic or inflammatory finding. Sinuses: Clear. Soft tissues: Negative. Limited intracranial: No significant or unexpected finding. IMPRESSION: No findings to explain the patient's symptoms. The temporomandibular joints appear normal. Electronically Signed   By: Romona Curls M.D.   On: 03/20/2023 15:50    Procedures Procedures   Medications Ordered in ED Medications  cyclobenzaprine (FLEXERIL) tablet 10 mg (10 mg Oral Given 03/20/23 1530)    ED Course/ Medical Decision Making/ A&P                           Medical Decision Making Amount and/or Complexity of Data Reviewed Radiology: ordered.  Risk Prescription drug management.   This patient presents to the ED for concern of jaw pain.  Differential diagnosis includes temporomandibular joint dysfunction, jaw dislocation, dental abscess  Imaging Studies ordered:  I ordered imaging studies including CT maxillofacial I independently visualized and interpreted imaging which showed no acute abnormality I agree with the radiologist interpretation   Medicines ordered and prescription drug management:  I ordered medication including Flexeril for jaw pain Reevaluation of the patient after these medicines showed that the patient improved I have reviewed the patients home medicines and have made adjustments as needed   Problem List / ED Course:  Patient presents emergency department complaints of jaw pain.  Reports that he has a somewhat chronic history of recurrent jaw pain and feeling of locking of the jaw.  Patient reports he has been seen by a chiropractor and a specialist for this without significant improvement.  Mother reports that she has been taking her muscle relaxer for symptomatic control but no significant improvement at this point.  Denies any recent fevers, sore throat.  Symptoms appear to be highly consistent  with temporomandibular joint dysfunction and there is no obvious dislocation on initial assessment.  Will evaluate with CT imaging and order dose of Flexeril and reassess patient. CT imaging negative for any acute abnormality of jaw or presence of any infectious etiology. Advised patient that this is highly consistent with temporomandibular joint dysfunction.  Patient had significant improvement in symptoms with Flexeril.  Will prescribe this medication for home use as well.  Encourage patient to follow-up with dentist/primary care provider for further evaluation.  Patient is agreeable treatment plan verbalized understanding all return precautions.  All questions answered prior to patient discharge.  Final Clinical Impression(s) / ED Diagnoses Final diagnoses:  Temporomandibular joint dysfunction    Rx / DC Orders ED Discharge Orders          Ordered    cyclobenzaprine (FLEXERIL) 10 MG tablet  2 times daily PRN        03/20/23 1714              Smitty Knudsen, PA-C 03/20/23  1717    Charlynne Pander, MD 03/21/23 [phone]

## 2023-03-20 NOTE — Discharge Instructions (Addendum)
You were seen in the emergency department for jaw pain.  Your CT scan did not show any acute abnormalities with the alignment of the jaw.  Your symptoms are highly consistent with temporomandibular joint dysfunction.  A prescription for Flexeril sent to your pharmacy.  As he had significant improvement with this medication here in the emergency department, would advise he take this medication starting in the evenings as it can cause some fatigue and sedation.  You should plan to follow-up with your dentist for further evaluation.

## 2023-03-29 ENCOUNTER — Ambulatory Visit (INDEPENDENT_AMBULATORY_CARE_PROVIDER_SITE_OTHER): Payer: BC Managed Care – PPO

## 2023-03-29 VITALS — BP 118/80 | HR 68 | Ht 64.0 in | Wt 135.0 lb

## 2023-03-29 DIAGNOSIS — Z3202 Encounter for pregnancy test, result negative: Secondary | ICD-10-CM | POA: Diagnosis not present

## 2023-03-29 DIAGNOSIS — Z3042 Encounter for surveillance of injectable contraceptive: Secondary | ICD-10-CM

## 2023-03-29 LAB — POCT URINE PREGNANCY: Preg Test, Ur: NEGATIVE

## 2023-03-29 MED ORDER — MEDROXYPROGESTERONE ACETATE 150 MG/ML IM SUSY
150.0000 mg | PREFILLED_SYRINGE | Freq: Once | INTRAMUSCULAR | Status: AC
  Administered 2023-03-29: 150 mg via INTRAMUSCULAR

## 2023-03-29 NOTE — Progress Notes (Signed)
Date last pap: 11/30/2022 Last Depo-Provera: Initial today Side Effects if any: N/A. Urine HCG? Negative  Depo-Provera 150 mg IM given by: Philemon Kingdom, CMA. Next appointment due: Sept 9th - Sept 23rd, 2024

## 2023-06-24 ENCOUNTER — Ambulatory Visit (INDEPENDENT_AMBULATORY_CARE_PROVIDER_SITE_OTHER): Payer: BC Managed Care – PPO

## 2023-06-24 VITALS — BP 128/75 | HR 73

## 2023-06-24 DIAGNOSIS — Z3042 Encounter for surveillance of injectable contraceptive: Secondary | ICD-10-CM

## 2023-06-24 MED ORDER — MEDROXYPROGESTERONE ACETATE 150 MG/ML IM SUSP
150.0000 mg | INTRAMUSCULAR | Status: AC
  Administered 2023-06-24 – 2024-08-10 (×3): 150 mg via INTRAMUSCULAR

## 2023-06-24 NOTE — Progress Notes (Signed)
Pt is in the office for depo injection, administered in R Del, per pt request, and pt tolerated well. Next due Dec 5- Dec 19 .Marland Kitchen Administrations This Visit     medroxyPROGESTERone (DEPO-PROVERA) injection 150 mg     Admin Date 06/24/2023 Action Given Dose 150 mg Route Intramuscular Documented By Katrina Stack, RN

## 2023-07-29 ENCOUNTER — Ambulatory Visit: Payer: BC Managed Care – PPO

## 2023-09-09 ENCOUNTER — Ambulatory Visit (INDEPENDENT_AMBULATORY_CARE_PROVIDER_SITE_OTHER): Payer: BC Managed Care – PPO

## 2023-09-09 VITALS — BP 131/88 | HR 102 | Wt 128.9 lb

## 2023-09-09 DIAGNOSIS — Z3042 Encounter for surveillance of injectable contraceptive: Secondary | ICD-10-CM

## 2023-09-09 NOTE — Progress Notes (Signed)
Pt is in the office for depo injection. Administered in L Del and pt tolerated well. Next due Feb 20- March 6. .. Administrations This Visit     medroxyPROGESTERone (DEPO-PROVERA) injection 150 mg     Admin Date 09/09/2023 Action Given Dose 150 mg Route Intramuscular Documented By Katrina Stack, RN

## 2023-11-30 ENCOUNTER — Ambulatory Visit: Payer: BC Managed Care – PPO

## 2023-11-30 ENCOUNTER — Other Ambulatory Visit: Payer: Self-pay

## 2023-11-30 DIAGNOSIS — Z30013 Encounter for initial prescription of injectable contraceptive: Secondary | ICD-10-CM

## 2023-11-30 MED ORDER — MEDROXYPROGESTERONE ACETATE 150 MG/ML IM SUSP: 150.0000 mg | INTRAMUSCULAR | 0 refills | Status: DC

## 2023-11-30 NOTE — Progress Notes (Signed)
 [facility] RX sent, pt needs AEX before more refills, front desk to schedule.

## 2023-12-01 ENCOUNTER — Ambulatory Visit (INDEPENDENT_AMBULATORY_CARE_PROVIDER_SITE_OTHER): Payer: BC Managed Care – PPO | Admitting: General Practice

## 2023-12-01 VITALS — BP 117/77 | HR 72 | Ht 63.0 in | Wt 132.0 lb

## 2023-12-01 DIAGNOSIS — Z3042 Encounter for surveillance of injectable contraceptive: Secondary | ICD-10-CM | POA: Diagnosis not present

## 2023-12-01 MED ORDER — MEDROXYPROGESTERONE ACETATE 150 MG/ML IM SUSP
150.0000 mg | Freq: Once | INTRAMUSCULAR | Status: AC
  Administered 2023-12-01: 150 mg via INTRAMUSCULAR

## 2023-12-01 NOTE — Progress Notes (Signed)
 Date last pap: 11-30-22. Last Depo-Provera: 09-09-23. Side Effects if any: N/A pt tolerated well. Serum HCG indicated? N/A Depo given on schedule. Depo-Provera 150 mg IM given by: Hope Pigeon, CMA in the RD per pt request. Next appointment due 5/14-5/28.

## 2024-01-10 ENCOUNTER — Encounter: Payer: Self-pay | Admitting: Obstetrics and Gynecology

## 2024-01-10 ENCOUNTER — Ambulatory Visit (INDEPENDENT_AMBULATORY_CARE_PROVIDER_SITE_OTHER): Payer: BC Managed Care – PPO | Admitting: Obstetrics and Gynecology

## 2024-01-10 ENCOUNTER — Other Ambulatory Visit (HOSPITAL_COMMUNITY)
Admission: RE | Admit: 2024-01-10 | Discharge: 2024-01-10 | Disposition: A | Discharge status: Home / Self Care | Source: Ambulatory Visit | Attending: Obstetrics and Gynecology | Admitting: Obstetrics and Gynecology

## 2024-01-10 VITALS — BP 131/73 | HR 86 | Ht 64.0 in | Wt 132.0 lb

## 2024-01-10 DIAGNOSIS — Z30013 Encounter for initial prescription of injectable contraceptive: Secondary | ICD-10-CM

## 2024-01-10 DIAGNOSIS — Z124 Encounter for screening for malignant neoplasm of cervix: Secondary | ICD-10-CM | POA: Diagnosis not present

## 2024-01-10 DIAGNOSIS — Z1151 Encounter for screening for human papillomavirus (HPV): Secondary | ICD-10-CM | POA: Diagnosis not present

## 2024-01-10 DIAGNOSIS — Z01419 Encounter for gynecological examination (general) (routine) without abnormal findings: Secondary | ICD-10-CM

## 2024-01-10 MED ORDER — MEDROXYPROGESTERONE ACETATE 150 MG/ML IM SUSP: 150.0000 mg | INTRAMUSCULAR | 3 refills | Status: DC

## 2024-01-10 NOTE — Progress Notes (Signed)
 ANNUAL EXAM Patient name: Victoria Meza MRN [id number]  Date of birth: Oct 05, 2001 Chief Complaint:   No chief complaint on file.  History of Present Illness:   Victoria Meza is a 23 y.o. G0P0000  female being seen today for a routine annual exam.  Current complaints: Doing well on Depo, has occasional BTB.  Reports moms aunt, history of cervical and ovarian cancer, strongly desires pap   Reports every 3-4 months has a pimple/cyst on areola  that drains. She has had an ultrasound few years ago to assess and it was normal   No LMP recorded. Patient has had an injection. Last injection 12/01/23   The pregnancy intention screening data noted above was reviewed. Potential methods of contraception were discussed. The patient elected to proceed with No data recorded.   Last pap 11/30/22. Results were: NILM w/ HRHPV negative. H/O abnormal pap: no Last mammogram: n/a. Results were: N/A. Family h/o breast cancer: mothers aunt breast cancer    2020 u/s     11/30/2022    9:15 AM  Depression screen PHQ 2/9  Decreased Interest 0  Down, Depressed, Hopeless 0  PHQ - 2 Score 0  Altered sleeping 0  Tired, decreased energy 2  Change in appetite 0  Feeling bad or failure about yourself  0  Trouble concentrating 0  Moving slowly or fidgety/restless 0  Suicidal thoughts 0  PHQ-9 Score 2        11/30/2022    9:16 AM  GAD 7 : Generalized Anxiety Score  Nervous, Anxious, on Edge 3  Control/stop worrying 3  Worry too much - different things 3  Trouble relaxing 3  Restless 0  Easily annoyed or irritable 3  Afraid - awful might happen 0  Total GAD 7 Score 15  Anxiety Difficulty Not difficult at all     Review of Systems:   Pertinent items are noted in HPI Denies any headaches, blurred vision, fatigue, shortness of breath, chest pain, abdominal pain, abnormal vaginal discharge/itching/odor/irritation, problems with periods, bowel movements, urination, or intercourse unless  otherwise stated above. Pertinent History Reviewed:  Reviewed past medical,surgical, social and family history.  Reviewed problem list, medications and allergies. Physical Assessment:   Vitals:   01/10/24 0817  BP: 131/73  Pulse: 86  Weight: 132 lb (59.9 kg)  Height: 5\' 4"  (1.626 m)  Body mass index is 22.66 kg/m.        Physical Examination:   General appearance - well appearing, and in no distress  Mental status - alert, oriented   Psych:  She has a normal mood and affect  Skin - warm and dry, normal color, no suspicious lesions noted  Chest - effort normal, all lung fields clear to auscultation bilaterally  Heart - normal rate and regular rhythm  Neck:  midline trachea, no thyromegaly or nodules  Breasts - breasts appear normal, no suspicious masses, no skin or nipple changes or  axillary nodes, no presence of cyst currently   Abdomen - soft, nontender  Pelvic - VULVA: normal appearing vulva with no masses, tenderness or lesions  VAGINA: normal appearing vagina with normal color and discharge, no lesions  CERVIX: normal appearing cervix without discharge or lesions, no CMT  Thin prep pap is done   UTERUS: uterus is felt to be normal size, shape, consistency and nontender   ADNEXA: No adnexal masses or tenderness noted.  Extremities:  No swelling or varicosities noted  Chaperone present for exam  No results found  for this or any previous visit (from the past 24 hours).  Assessment & Plan:  1. Encounter for annual routine gynecological examination (Primary) Discussed hx breast cyst, will look in to referral  Continue routine self breast exams  2. Encounter for initial prescription of injectable contraceptive Doing well, continue depo, rx sent  - medroxyPROGESTERone (DEPO-PROVERA) 150 MG/ML injection; Inject 1 mL (150 mg total) into the muscle every 3 (three) months. Needs an annual exam before more refills can be prescribed.  Dispense: 1 mL; Refill: 3  3. Cervical cancer  screening Desires repeat - Cytology - PAP( [place])    Labs/procedures today:   Mammogram: @ 25yo, or sooner if problems   No orders of the defined types were placed in this encounter.   Meds: No orders of the defined types were placed in this encounter.   Follow-up: Return in about 1 year (around 01/09/2025) for Romayne Clubs. Future Appointments  Date Time Provider Department Center  02/21/2024  9:20 AM [facility] NURSE [facility] None     Victoria Eric, FNP

## 2024-01-10 NOTE — Progress Notes (Signed)
 Pt presents for AEX Last PAP 11/2022 Needs Depo refill  No concerns

## 2024-01-11 LAB — CYTOLOGY - PAP: Diagnosis: NEGATIVE

## 2024-02-21 ENCOUNTER — Other Ambulatory Visit: Payer: Self-pay

## 2024-02-21 ENCOUNTER — Ambulatory Visit: Payer: BC Managed Care – PPO

## 2024-02-21 DIAGNOSIS — Z30013 Encounter for initial prescription of injectable contraceptive: Secondary | ICD-10-CM

## 2024-02-21 MED ORDER — MEDROXYPROGESTERONE ACETATE 150 MG/ML IM SUSP: 150.0000 mg | INTRAMUSCULAR | 3 refills | Status: AC

## 2024-03-01 ENCOUNTER — Ambulatory Visit

## 2024-03-01 VITALS — BP 125/76 | HR 76

## 2024-03-01 DIAGNOSIS — Z3042 Encounter for surveillance of injectable contraceptive: Secondary | ICD-10-CM | POA: Diagnosis not present

## 2024-03-01 DIAGNOSIS — Z30013 Encounter for initial prescription of injectable contraceptive: Secondary | ICD-10-CM

## 2024-03-01 MED ORDER — MEDROXYPROGESTERONE ACETATE 150 MG/ML IM SUSP
150.0000 mg | Freq: Once | INTRAMUSCULAR | Status: AC
  Administered 2024-03-01: 150 mg via INTRAMUSCULAR

## 2024-03-01 NOTE — Progress Notes (Signed)
 Date last pap: 11/30/22. Last Depo-Provera : 12/01/23. Side Effects if any: NA. Serum HCG indicated? NA. Depo-Provera  150 mg IM given by: Artemio Bilberry, RN. Next appointment due August 13 - 27 2025.

## 2024-05-17 ENCOUNTER — Ambulatory Visit

## 2024-05-17 DIAGNOSIS — Z3042 Encounter for surveillance of injectable contraceptive: Secondary | ICD-10-CM

## 2024-05-17 MED ORDER — MEDROXYPROGESTERONE ACETATE 150 MG/ML IM SUSP
150.0000 mg | Freq: Once | INTRAMUSCULAR | Status: AC
  Administered 2024-05-17 (×2): 150 mg via INTRAMUSCULAR

## 2024-05-17 NOTE — Progress Notes (Signed)
 Date last pap: 01/10/24. Last Depo-Provera : 03/01/24. Side Effects if any: some breakthrough spotting. Serum HCG indicated? N/a. Depo-Provera  150 mg IM given by: Jaymin Waln, rn. Next appointment due oct 29- Aug 16, 2024.

## 2024-06-12 ENCOUNTER — Ambulatory Visit: Admitting: Obstetrics and Gynecology

## 2024-08-09 ENCOUNTER — Ambulatory Visit

## 2024-08-10 ENCOUNTER — Ambulatory Visit (INDEPENDENT_AMBULATORY_CARE_PROVIDER_SITE_OTHER)

## 2024-08-10 ENCOUNTER — Other Ambulatory Visit (HOSPITAL_COMMUNITY)
Admission: RE | Admit: 2024-08-10 | Discharge: 2024-08-10 | Disposition: A | Source: Ambulatory Visit | Attending: Obstetrics | Admitting: Obstetrics

## 2024-08-10 VITALS — BP 115/72 | HR 88 | Wt 138.0 lb

## 2024-08-10 DIAGNOSIS — N898 Other specified noninflammatory disorders of vagina: Secondary | ICD-10-CM | POA: Insufficient documentation

## 2024-08-10 DIAGNOSIS — Z3042 Encounter for surveillance of injectable contraceptive: Secondary | ICD-10-CM | POA: Diagnosis not present

## 2024-08-10 NOTE — Progress Notes (Signed)
..  SUBJECTIVE:  23 y.o. female complains of vaginal irritation and itching after completing antibiotic course. Pt also reports chaffing around labia that started before current symptoms reported today. Advised pt to make provider visit to be evaluated. Denies abnormal vaginal bleeding or significant pelvic pain or fever. No UTI symptoms. Denies history of known exposure to STD. Pt is also due for depo injection today.  No LMP recorded. Patient has had an injection.  OBJECTIVE:  She appears well, afebrile. Urine dipstick: not done.  ASSESSMENT:  Vaginal Irritation/itching Administered Depo in L Del and pt tolerated well.  Administrations This Visit     medroxyPROGESTERone  (DEPO-PROVERA ) injection 150 mg     Admin Date 08/10/2024 Action Given Dose 150 mg Route Intramuscular Documented By Doneta Laymon BIRCH, RN            PLAN:  GC, chlamydia, trichomonas, BVAG, CVAG probe sent to lab. Treatment: To be determined once lab results are received ROV prn if symptoms persist or worsen. Next depo due Jan 22- Feb 5.

## 2024-08-11 LAB — CERVICOVAGINAL ANCILLARY ONLY
Bacterial Vaginitis (gardnerella): NEGATIVE
Candida Glabrata: NEGATIVE
Candida Vaginitis: POSITIVE — AB
Chlamydia: NEGATIVE
Comment: NEGATIVE
Comment: NEGATIVE
Comment: NEGATIVE
Comment: NEGATIVE
Comment: NEGATIVE
Comment: NORMAL
Neisseria Gonorrhea: NEGATIVE
Trichomonas: NEGATIVE

## 2024-08-14 ENCOUNTER — Encounter: Payer: Self-pay | Admitting: Physician Assistant

## 2024-08-14 ENCOUNTER — Ambulatory Visit: Payer: Self-pay | Admitting: Obstetrics and Gynecology

## 2024-08-14 DIAGNOSIS — B3731 Acute candidiasis of vulva and vagina: Secondary | ICD-10-CM

## 2024-08-14 MED ORDER — FLUCONAZOLE 150 MG PO TABS: 150.0000 mg | ORAL_TABLET | Freq: Once | ORAL | 0 refills | Status: AC

## 2024-08-24 ENCOUNTER — Other Ambulatory Visit: Payer: Self-pay | Admitting: *Deleted

## 2024-08-24 DIAGNOSIS — N611 Abscess of the breast and nipple: Secondary | ICD-10-CM

## 2025-03-26 ENCOUNTER — Ambulatory Visit: Admitting: Dermatology
